# Patient Record
Sex: Male | Born: 1970 | Race: Black or African American | Hispanic: No | Marital: Single
Health system: Midwestern US, Community
[De-identification: ages and names within clinical notes are randomized; demographics above are authoritative.]

## PROBLEM LIST (undated history)

## (undated) DIAGNOSIS — I10 Essential (primary) hypertension: Secondary | ICD-10-CM

---

## 2017-06-24 ENCOUNTER — Inpatient Hospital Stay
Admit: 2017-06-24 | Discharge: 2017-06-24 | Disposition: A | Discharge status: Home / Self Care | Attending: Emergency Medicine

## 2017-06-24 ENCOUNTER — Emergency Department: Admit: 2017-06-24

## 2017-06-24 DIAGNOSIS — R05 Cough: Secondary | ICD-10-CM

## 2017-06-24 LAB — URINE DRUG SCREEN
Amphetamine Screen, Urine: NEGATIVE (ref <1000)
Barbiturate Screen, Ur: NEGATIVE (ref <200)
Benzodiazepine Screen, Urine: NEGATIVE (ref <100)
Cannabinoid Scrn, Ur: POSITIVE — AB (ref <50)
Cocaine Metabolite Screen, Urine: NEGATIVE (ref <300)
Opiate Scrn, Ur: NEGATIVE (ref <300)

## 2017-06-24 LAB — COMPREHENSIVE METABOLIC PANEL
ALT: 16 U/L (ref 5–41)
AST: 16 U/L (ref 5–40)
Albumin: 4.3 g/dL (ref 3.5–5.2)
Alkaline Phosphatase: 63 U/L (ref 40–130)
Anion Gap: 12 mmol/L (ref 7–19)
BUN: 10 mg/dL (ref 6–20)
CO2: 24 mmol/L (ref 22–29)
Calcium: 9.1 mg/dL (ref 8.6–10.0)
Chloride: 103 mmol/L (ref 98–111)
Creatinine: 0.7 mg/dL (ref 0.5–1.2)
GFR Non-African American: >60 (ref >60)
Glucose: 137 mg/dL — ABNORMAL HIGH (ref 74–109)
Potassium: 3.7 mmol/L (ref 3.5–5.0)
Sodium: 139 mmol/L (ref 136–145)
Total Bilirubin: <0.2 mg/dL (ref 0.2–1.2)
Total Protein: 7.4 g/dL (ref 6.6–8.7)

## 2017-06-24 LAB — URINALYSIS WITH REFLEX TO CULTURE
Bilirubin Urine: NEGATIVE
Blood, Urine: NEGATIVE
Glucose, Ur: NEGATIVE mg/dL
Ketones, Urine: NEGATIVE mg/dL
Leukocyte Esterase, Urine: NEGATIVE
Nitrite, Urine: NEGATIVE
Protein, UA: NEGATIVE mg/dL
Specific Gravity, UA: 1.019 (ref 1.005–1.030)
Urobilinogen, Urine: 0.2 E.U./dL (ref <2.0)
pH, UA: 6.5 (ref 5.0–8.0)

## 2017-06-24 LAB — CBC WITH AUTO DIFFERENTIAL
Basophils %: 0.5 % (ref 0.0–1.0)
Basophils Absolute: 0 10*3/uL (ref 0.00–0.20)
Eosinophils %: 2.1 % (ref 0.0–5.0)
Eosinophils Absolute: 0.2 10*3/uL (ref 0.00–0.60)
Hematocrit: 47.5 % (ref 42.0–52.0)
Hemoglobin: 15.1 g/dL (ref 14.0–18.0)
Lymphocytes %: 16.6 % — ABNORMAL LOW (ref 20.0–40.0)
Lymphocytes Absolute: 1.4 10*3/uL (ref 1.1–4.5)
MCH: 28.2 pg (ref 27.0–31.0)
MCHC: 31.8 g/dL — ABNORMAL LOW (ref 33.0–37.0)
MCV: 88.6 fL (ref 80.0–94.0)
MPV: 9.3 fL — ABNORMAL LOW (ref 9.4–12.4)
Monocytes %: 8.6 % (ref 0.0–10.0)
Monocytes Absolute: 0.7 10*3/uL (ref 0.00–0.90)
Neutrophils %: 72 % — ABNORMAL HIGH (ref 50.0–65.0)
Neutrophils Absolute: 5.9 10*3/uL (ref 1.5–7.5)
Platelets: 251 10*3/uL (ref 130–400)
RBC: 5.36 M/uL (ref 4.70–6.10)
RDW: 14.1 % (ref 11.5–14.5)
WBC: 8.2 10*3/uL (ref 4.8–10.8)

## 2017-06-24 LAB — BRAIN NATRIURETIC PEPTIDE: Pro-BNP: <5 pg/mL (ref 0–450)

## 2017-06-24 LAB — TROPONIN
Troponin: <0.01 ng/mL (ref 0.00–0.03)
Troponin: <0.01 ng/mL (ref 0.00–0.03)

## 2017-06-24 LAB — LIPASE: Lipase: 14 U/L (ref 13–60)

## 2017-06-24 LAB — PROTIME-INR
INR: 1.03 (ref 0.88–1.18)
Protime: 12.9 s (ref 12.0–14.6)

## 2017-06-24 LAB — ETHANOL: Ethanol Lvl: 12 mg/dL

## 2017-06-24 MED ORDER — IPRATROPIUM-ALBUTEROL 0.5-2.5 (3) MG/3ML IN SOLN
0.5-2.533 (3) MG/3ML | Freq: Once | RESPIRATORY_TRACT | Status: AC
Start: 2017-06-24 — End: 2017-06-24
  Administered 2017-06-24: 07:00:00 2 via RESPIRATORY_TRACT

## 2017-06-24 MED FILL — IPRATROPIUM-ALBUTEROL 0.5-2.5 (3) MG/3ML IN SOLN: 0.5-2.5 (3) MG/3ML | RESPIRATORY_TRACT | Qty: 3

## 2017-06-24 NOTE — ED Notes (Signed)
Pt laying in bed snoring, no distress noted at this time.      Hart Rochester, RN  06/24/17 [phone]
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
## 2017-06-24 NOTE — ED Notes (Signed)
 Bed: 08  Expected date:   Expected time:   Means of arrival: [facility]  Comments:  EMS: overheated     Janeice Robinson, RN  06/24/17 [phone]

## 2017-06-25 LAB — EKG 12-LEAD
P Axis: 65 degrees
P Axis: 71 degrees
P-R Interval: 144 ms
P-R Interval: 150 ms
Q-T Interval: 364 ms
Q-T Interval: 376 ms
QRS Duration: 78 ms
QRS Duration: 82 ms
QTc Calculation (Bazett): 413 ms
QTc Calculation (Bazett): 416 ms
T Axis: 1 degrees
T Axis: 3 degrees

## 2019-02-17 ENCOUNTER — Emergency Department (HOSPITAL_COMMUNITY)
Admission: EM | Admit: 2019-02-17 | Discharge: 2019-02-17 | Disposition: A | Discharge status: Home / Self Care | Payer: No Typology Code available for payment source | Attending: Emergency Medicine | Admitting: Emergency Medicine

## 2019-02-17 ENCOUNTER — Emergency Department (HOSPITAL_COMMUNITY): Payer: No Typology Code available for payment source

## 2019-02-17 ENCOUNTER — Encounter (HOSPITAL_COMMUNITY): Payer: Self-pay

## 2019-02-17 DIAGNOSIS — M549 Dorsalgia, unspecified: Secondary | ICD-10-CM | POA: Diagnosis present

## 2019-02-17 DIAGNOSIS — M546 Pain in thoracic spine: Secondary | ICD-10-CM | POA: Diagnosis not present

## 2019-02-17 DIAGNOSIS — M542 Cervicalgia: Secondary | ICD-10-CM | POA: Diagnosis not present

## 2019-02-17 DIAGNOSIS — Y92410 Unspecified street and highway as the place of occurrence of the external cause: Secondary | ICD-10-CM | POA: Diagnosis not present

## 2019-02-17 DIAGNOSIS — Y999 Unspecified external cause status: Secondary | ICD-10-CM | POA: Insufficient documentation

## 2019-02-17 DIAGNOSIS — R0781 Pleurodynia: Secondary | ICD-10-CM | POA: Insufficient documentation

## 2019-02-17 DIAGNOSIS — M545 Low back pain: Secondary | ICD-10-CM | POA: Diagnosis not present

## 2019-02-17 MED ORDER — METHOCARBAMOL 500 MG PO TABS
750.0000 mg | ORAL_TABLET | Freq: Once | ORAL | Status: AC
  Administered 2019-02-17: 750 mg via ORAL
  Filled 2019-02-17: qty 2

## 2019-02-17 MED ORDER — ACETAMINOPHEN 500 MG PO TABS: 500.0000 mg | ORAL_TABLET | Freq: Four times a day (QID) | ORAL | 0 refills | Status: AC | PRN

## 2019-02-17 MED ORDER — METHOCARBAMOL 750 MG PO TABS: 750.0000 mg | ORAL_TABLET | Freq: Two times a day (BID) | ORAL | 0 refills | Status: AC

## 2019-02-17 MED ORDER — IBUPROFEN 600 MG PO TABS: 600.0000 mg | ORAL_TABLET | Freq: Four times a day (QID) | ORAL | 0 refills | Status: AC | PRN

## 2019-02-17 NOTE — ED Notes (Addendum)
Pt called to go back to room. Pt began using profanity and getting upset because the person he came in with was called back so much sooner than him. This RN attempted to calm pt and explain to him that we have been busy and that we are getting him back now. Pt continued to raise his voice ans speak rudely to this RN on the way back to the room. This RN calmly asked pt to stop using profanity and again tried to explain the situation. Pt continued to be disrespectful towards staff. After room was closed pt yelled out "f* all of y'all".
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
## 2019-02-17 NOTE — ED Notes (Signed)
Pt upset about his wait time This writer explained to pt that he was waiting for the Fast track/less urgent area of care Pt angry that his friend who he came in with was placed into a room before him This writer tried to explain to the pt that there was a room available when his friend checked in, therefore she went to that one. This Probation officer attempted to explain how rooming pts worked, but pt was argumentative Pt began to curse this Probation officer and say "stupid, nigger bitch! I'm going to another fucking hospital!" Pt encouraged to stay but made aware that he is free to go to another hospital. Pt continued to curse and yell in the lobby even though he was asked repeatedly to not do so. This Probation officer wheeled the pt outside the lobby doors to prevent him from disrupting the calm environment of the lobby Pt informed that when a room became available for him, we would be out to get him. Pt informed that his behavior will not be tolerated.
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
## 2023-07-12 ENCOUNTER — Encounter (HOSPITAL_BASED_OUTPATIENT_CLINIC_OR_DEPARTMENT_OTHER): Payer: Self-pay

## 2023-07-12 ENCOUNTER — Other Ambulatory Visit: Payer: Self-pay

## 2023-07-12 ENCOUNTER — Emergency Department (HOSPITAL_BASED_OUTPATIENT_CLINIC_OR_DEPARTMENT_OTHER)
Admission: EM | Admit: 2023-07-12 | Discharge: 2023-07-12 | Disposition: A | Discharge status: Home / Self Care | Attending: Emergency Medicine | Admitting: Emergency Medicine

## 2023-07-12 DIAGNOSIS — J020 Streptococcal pharyngitis: Secondary | ICD-10-CM | POA: Diagnosis not present

## 2023-07-12 DIAGNOSIS — R000 Tachycardia, unspecified: Secondary | ICD-10-CM | POA: Insufficient documentation

## 2023-07-12 DIAGNOSIS — I10 Essential (primary) hypertension: Secondary | ICD-10-CM | POA: Insufficient documentation

## 2023-07-12 DIAGNOSIS — J029 Acute pharyngitis, unspecified: Secondary | ICD-10-CM | POA: Diagnosis present

## 2023-07-12 HISTORY — DX: Essential (primary) hypertension: I10

## 2023-07-12 LAB — GROUP A STREP BY PCR: Group A Strep by PCR: DETECTED — AB

## 2023-07-12 LAB — RESP PANEL BY RT-PCR (RSV, FLU A&B, COVID)  RVPGX2
Influenza A by PCR: NEGATIVE
Influenza B by PCR: NEGATIVE
Resp Syncytial Virus by PCR: NEGATIVE
SARS Coronavirus 2 by RT PCR: NEGATIVE

## 2023-07-12 MED ORDER — ACETAMINOPHEN 325 MG PO TABS
650.0000 mg | ORAL_TABLET | Freq: Once | ORAL | Status: AC | PRN
  Administered 2023-07-12: 650 mg via ORAL
  Filled 2023-07-12: qty 2

## 2023-07-12 MED ORDER — DEXAMETHASONE SODIUM PHOSPHATE 10 MG/ML IJ SOLN
10.0000 mg | Freq: Once | INTRAMUSCULAR | Status: AC
  Administered 2023-07-12: 10 mg via INTRAMUSCULAR

## 2023-07-12 MED ORDER — DEXAMETHASONE SODIUM PHOSPHATE 10 MG/ML IJ SOLN
10.0000 mg | Freq: Once | INTRAMUSCULAR | Status: DC
  Filled 2023-07-12: qty 1

## 2023-07-12 MED ORDER — PENICILLIN G BENZATHINE 1200000 UNIT/2ML IM SUSY
1.2000 10*6.[IU] | PREFILLED_SYRINGE | Freq: Once | INTRAMUSCULAR | Status: AC
  Administered 2023-07-12: 1.2 10*6.[IU] via INTRAMUSCULAR
  Filled 2023-07-12: qty 2

## 2023-07-12 NOTE — Discharge Instructions (Addendum)
 Please use Tylenol  or ibuprofen  for pain, fever.  You may use 600 mg ibuprofen  every 6 hours or 1000 mg of Tylenol  every 6 hours.  You may choose to alternate between the 2.  This would be most effective.  Not to exceed 4 g of Tylenol  within 24 hours.  Not to exceed 3200 mg ibuprofen  24 hours.  The antibiotics and steroids should treat your infection, and symptoms should continue to improve over the next week.  Please follow-up if your symptoms worsen instead of improve, or you begin develop inability to swallow.

## 2023-07-12 NOTE — ED Triage Notes (Signed)
 Pt reports sore throat x 4 days. Pt suspects strep throat . Muffled voice. Painful and difficulty swallow

## 2023-07-12 NOTE — ED Provider Notes (Signed)
 [place] [facility] Provider Note   CSN: [id number] Arrival date & time: 07/12/23  1505     History  Chief Complaint  Patient presents with   Sore Throat    Charles Becker is a 53 y.o. male with past medical history significant for hypertension who presents with concern for sore throat for 4 days.  He endorses concern that it could be strep throat.  He denies any recent sick contacts.  Endorses difficulty swallowing secondary to pain.  He rates the pain 10/10.  Nothing for pain or fever prior to arrival.   Sore Throat       Home Medications Prior to Admission medications   Medication Sig Start Date End Date Taking? Authorizing Provider  acetaminophen  (TYLENOL ) 500 MG tablet Take 1 tablet (500 mg total) by mouth every 6 (six) hours as needed. 02/17/19   Law, Alexandra M, PA-C  ibuprofen  (ADVIL ) 600 MG tablet Take 1 tablet (600 mg total) by mouth every 6 (six) hours as needed. 02/17/19   Law, Alexandra M, PA-C  methocarbamol  (ROBAXIN ) 750 MG tablet Take 1 tablet (750 mg total) by mouth 2 (two) times daily. 02/17/19   Law, Alexandra M, PA-C      Allergies    Fish allergy    Review of Systems   Review of Systems  All other systems reviewed and are negative.   Physical Exam Updated Vital Signs BP (!) 146/99   Pulse (!) 131   Temp 100.1 F (37.8 C) (Oral)   Resp 18   Wt 83.9 kg   SpO2 94%   BMI 28.13 kg/m  Physical Exam Vitals and nursing note reviewed.  Constitutional:      General: He is not in acute distress.    Appearance: Normal appearance.  HENT:     Head: Normocephalic and atraumatic.     Mouth/Throat:     Comments: Patient with swollen posterior oropharynx but with midline uvula, no unilateral tonsillar swelling, mild exudate noted. Eyes:     General:        Right eye: No discharge.        Left eye: No discharge.  Cardiovascular:     Rate and Rhythm: Regular rhythm. Tachycardia present.     Heart sounds: No murmur  heard.    No friction rub. No gallop.  Pulmonary:     Effort: Pulmonary effort is normal.     Breath sounds: Normal breath sounds.  Skin:    General: Skin is warm and dry.     Capillary Refill: Capillary refill takes less than 2 seconds.  Neurological:     Mental Status: He is alert and oriented to person, place, and time.  Psychiatric:        Mood and Affect: Mood normal.        Behavior: Behavior normal.     ED Results / Procedures / Treatments   Labs (all labs ordered are listed, but only abnormal results are displayed) Labs Reviewed  GROUP A STREP BY PCR - Abnormal; Notable for the following components:      Result Value   Group A Strep by PCR DETECTED (*)    All other components within normal limits  RESP PANEL BY RT-PCR (RSV, FLU A&B, COVID)  [id number]    EKG None  Radiology No results found.  Procedures Procedures    Medications Ordered in ED Medications  penicillin  g benzathine (BICILLIN  LA) 1200000 UNIT/2ML injection 1.2 Million Units (has no administration  in time range)  dexamethasone  (DECADRON ) injection 10 mg (has no administration in time range)  acetaminophen  (TYLENOL ) tablet 650 mg (650 mg Oral Given 07/12/23 1534)    ED Course/ Medical Decision Making/ A&P                                 Medical Decision Making Risk OTC drugs.   This is an overall well-appearing 53 year old male who presents concern for sore throat for 4 days.  He has fever.  He reports difficulty swallowing.  On my exam he does have some swelling of the tonsils bilaterally, some exudate, mild trismus but no evidence of acute respiratory distress.  He is borderline temperature on arrival, temp 100.1, he is tachycardic, pulse 131.  Blood pressure elevated 146/99.  I independently interpreted his lab work including negative RVP, positive strep PCR.  Discussed with patient that I do not see any evidence of PTA, retropharyngeal abscess, epiglottitis, symptoms do seem consistent with strep  pharyngitis.  Will administer penicillin  and Decadron , encouraged Tylenol , ibuprofen  for fever.  Discussed extensive return precautions for worsening symptoms despite treatment.  PCP follow up or return to the ED encouraged as needed. Final Clinical Impression(s) / ED Diagnoses Final diagnoses:  Strep pharyngitis    Rx / DC Orders ED Discharge Orders     None         Nelly Banco, PA-C 07/12/23 1643    Nicklas Barns, MD 07/14/23 [phone]
